# Patient Record
Sex: Male | Born: 1990 | Race: Black or African American | Hispanic: No | Marital: Single
Health system: Southern US, Community
[De-identification: ages and names within clinical notes are randomized; demographics above are authoritative.]

## PROBLEM LIST (undated history)

## (undated) DIAGNOSIS — L309 Dermatitis, unspecified: Secondary | ICD-10-CM

---

## 2010-12-05 ENCOUNTER — Encounter: Payer: Self-pay | Admitting: *Deleted

## 2010-12-05 ENCOUNTER — Emergency Department (HOSPITAL_COMMUNITY)
Admission: EM | Admit: 2010-12-05 | Discharge: 2010-12-05 | Disposition: A | Discharge status: Home / Self Care | Attending: Emergency Medicine | Admitting: Emergency Medicine

## 2010-12-05 DIAGNOSIS — H1030 Unspecified acute conjunctivitis, unspecified eye: Secondary | ICD-10-CM | POA: Insufficient documentation

## 2010-12-05 DIAGNOSIS — L309 Dermatitis, unspecified: Secondary | ICD-10-CM

## 2010-12-05 DIAGNOSIS — L259 Unspecified contact dermatitis, unspecified cause: Secondary | ICD-10-CM | POA: Insufficient documentation

## 2010-12-05 DIAGNOSIS — R21 Rash and other nonspecific skin eruption: Secondary | ICD-10-CM | POA: Insufficient documentation

## 2010-12-05 HISTORY — DX: Dermatitis, unspecified: L30.9

## 2010-12-05 MED ORDER — PREDNISONE 10 MG PO TABS: ORAL_TABLET | ORAL | Status: DC

## 2010-12-05 MED ORDER — TOBRAMYCIN 0.3 % OP SOLN
2.0000 [drp] | Freq: Once | OPHTHALMIC | Status: AC
  Administered 2010-12-05: 2 [drp] via OPHTHALMIC
  Filled 2010-12-05: qty 5

## 2010-12-05 MED ORDER — DEXAMETHASONE SODIUM PHOSPHATE 4 MG/ML IJ SOLN
10.0000 mg | Freq: Once | INTRAMUSCULAR | Status: AC
  Administered 2010-12-05: 10 mg via INTRAMUSCULAR
  Filled 2010-12-05: qty 3

## 2010-12-05 NOTE — ED Provider Notes (Signed)
History     CSN: [id number] Arrival date & time: 12/05/2010 10:39 AM  Chief Complaint  Patient presents with  . Rash    (Consider location/radiation/quality/duration/timing/severity/associated sxs/prior treatment) HPI Comments: Patient with hx of eczema c/o itching and "eczema flare up" for one week.  States that he has intermittent episodes of exacerbations of his eczema.  States he had similar episode 2-3 months ago and it improved after steroids.  He denies fever, pain, neck stiffness or headache.  He also c/o redness, drainage to the left eye for several days.  States that his left eye is "matted shut" in the mornings.    Patient is a 20 y.o. male presenting with rash. The history is provided by the patient.  Rash  This is a recurrent problem. The current episode started more than 2 days ago. The problem has not changed since onset.The problem is associated with nothing. There has been no fever. The rash is present on the neck, left arm, right arm, left lower leg, right lower leg and trunk. The patient is experiencing no pain. The pain has been constant since onset. Associated symptoms include itching. Pertinent negatives include no blisters, no pain and no weeping. He has tried nothing for the symptoms. The treatment provided no relief.    Past Medical History  Diagnosis Date  . Eczema     History reviewed. No pertinent past surgical history.  History reviewed. No pertinent family history.  History  Substance Use Topics  . Smoking status: Passive Smoker  . Smokeless tobacco: Not on file  . Alcohol Use: Yes     socially      Review of Systems  Constitutional: Negative for activity change and appetite change.  HENT: Negative for facial swelling, neck pain and neck stiffness.   Eyes: Positive for discharge, redness and itching. Negative for photophobia, pain and visual disturbance.  Respiratory: Negative for chest tightness.   Skin: Positive for itching and rash. Negative  for wound.  Neurological: Negative for dizziness, weakness, light-headedness and numbness.  Hematological: Negative for adenopathy. Does not bruise/bleed easily.  All other systems reviewed and are negative.    Allergies  Review of patient's allergies indicates no known allergies.  Home Medications  No current outpatient prescriptions on file.  BP 143/70  Pulse 94  Temp(Src) 98.8 F (37.1 C) (Oral)  Resp 14  Ht 5\' 10"  (1.778 m)  Wt 161 lb (73.029 kg)  BMI 23.10 kg/m2  SpO2 100%  Physical Exam  Constitutional: He is oriented to person, place, and time. He appears well-developed and well-nourished. No distress.  HENT:  Head: Normocephalic and atraumatic.  Mouth/Throat: Oropharynx is clear and moist.  Eyes: EOM are normal. Pupils are equal, round, and reactive to light. Left eye exhibits discharge and exudate. Left eye exhibits no chemosis and no hordeolum. No foreign body present in the left eye. Left conjunctiva is injected. No scleral icterus. Right pupil is round and reactive. Left pupil is round and reactive. Pupils are equal.  Musculoskeletal: He exhibits no edema and no tenderness.  Neurological: He is alert and oriented to person, place, and time. He has normal reflexes. No cranial nerve deficit. He exhibits normal muscle tone. Coordination normal.  Skin: Rash noted. No petechiae and no purpura noted. Rash is maculopapular. Rash is not nodular, not pustular, not vesicular and not urticarial.       Diffuse papular rash to various areas of the body including the left neck, bilateral UE's, left lower leg and chest.  No vesicles, erythema or drainage.  Mild scaling.      ED Course  Procedures (including critical care time)      MDM    Diffuse, papular rash to the left neck, left arm and bilateral LE's.  Also has purulent drainage in the left medial canthus of the eye with mild injection of the conjunctiva.  Patient is alert, vitals stable, non-toxic appearing.           Ora Bollig L. [place], [place] 12/06/10 [phone]

## 2010-12-05 NOTE — ED Notes (Signed)
Pt states has eczema has had all his life, has gotten worse past few days.  Pt has noted dry scaly patches on bilateral legs, rt wrist and at the shirt collar area. Also has drainage from rt eye, eye is reddned with scaly   Patch noted at eyebrow area. Pt states does itch and is uncomfortable.

## 2010-12-05 NOTE — ED Notes (Signed)
Pt c/o eczema flare up x 1 week; pt has rash all over body

## 2010-12-06 ENCOUNTER — Encounter (HOSPITAL_COMMUNITY): Payer: Self-pay | Admitting: Emergency Medicine

## 2010-12-13 NOTE — ED Provider Notes (Signed)
Medical screening examination/treatment/procedure(s) were performed by non-physician practitioner and as supervising physician I was immediately available for consultation/collaboration.   Forbes Cellar, MD 12/13/10 [phone]

## 2013-10-03 ENCOUNTER — Emergency Department (HOSPITAL_COMMUNITY)
Admission: EM | Admit: 2013-10-03 | Discharge: 2013-10-03 | Disposition: A | Discharge status: Home / Self Care | Attending: Emergency Medicine | Admitting: Emergency Medicine

## 2013-10-03 ENCOUNTER — Emergency Department (HOSPITAL_COMMUNITY)

## 2013-10-03 ENCOUNTER — Encounter (HOSPITAL_COMMUNITY): Payer: Self-pay | Admitting: Emergency Medicine

## 2013-10-03 DIAGNOSIS — IMO0002 Reserved for concepts with insufficient information to code with codable children: Secondary | ICD-10-CM | POA: Insufficient documentation

## 2013-10-03 DIAGNOSIS — S5011XA Contusion of right forearm, initial encounter: Secondary | ICD-10-CM

## 2013-10-03 DIAGNOSIS — S46909A Unspecified injury of unspecified muscle, fascia and tendon at shoulder and upper arm level, unspecified arm, initial encounter: Secondary | ICD-10-CM | POA: Insufficient documentation

## 2013-10-03 DIAGNOSIS — S5010XA Contusion of unspecified forearm, initial encounter: Secondary | ICD-10-CM | POA: Insufficient documentation

## 2013-10-03 DIAGNOSIS — W208XXA Other cause of strike by thrown, projected or falling object, initial encounter: Secondary | ICD-10-CM | POA: Insufficient documentation

## 2013-10-03 DIAGNOSIS — Y9389 Activity, other specified: Secondary | ICD-10-CM | POA: Insufficient documentation

## 2013-10-03 DIAGNOSIS — IMO0001 Reserved for inherently not codable concepts without codable children: Secondary | ICD-10-CM | POA: Insufficient documentation

## 2013-10-03 DIAGNOSIS — S20211A Contusion of right front wall of thorax, initial encounter: Secondary | ICD-10-CM

## 2013-10-03 DIAGNOSIS — Y9289 Other specified places as the place of occurrence of the external cause: Secondary | ICD-10-CM | POA: Insufficient documentation

## 2013-10-03 DIAGNOSIS — Z872 Personal history of diseases of the skin and subcutaneous tissue: Secondary | ICD-10-CM | POA: Insufficient documentation

## 2013-10-03 DIAGNOSIS — S20219A Contusion of unspecified front wall of thorax, initial encounter: Secondary | ICD-10-CM | POA: Insufficient documentation

## 2013-10-03 DIAGNOSIS — S4980XA Other specified injuries of shoulder and upper arm, unspecified arm, initial encounter: Secondary | ICD-10-CM | POA: Insufficient documentation

## 2013-10-03 MED ORDER — IBUPROFEN 600 MG PO TABS: 600.0000 mg | ORAL_TABLET | Freq: Four times a day (QID) | ORAL | Status: AC | PRN

## 2013-10-03 MED ORDER — IBUPROFEN 800 MG PO TABS
800.0000 mg | ORAL_TABLET | Freq: Once | ORAL | Status: AC
  Administered 2013-10-03: 800 mg via ORAL
  Filled 2013-10-03: qty 1

## 2013-10-03 NOTE — ED Provider Notes (Signed)
CSN: [id number]     Arrival date & time 10/03/13  1035 History   First MD Initiated Contact with Patient 10/03/13 1125   This chart was scribed for non-physician practitioner Burgess Amor, PA-C working with Benny Lennert, MD, by Andrew Au, ED Scribe. This patient was seen in room APFT20/APFT20 and the patient's care was started at 12:40 PM. Chief Complaint  Patient presents with  . Arm Pain    Patient is a 23 y.o. male presenting with arm pain. The history is provided by the patient. No language interpreter was used.  Arm Pain   Erik Olsen is a 23 y.o. male who presents to the Emergency Department complaining of right arm pain and right back pain. Pt reports he was opening a door when it fell off the hinges, landing on his right forearm and hitting his right back yesterday.  He reports pain is worse in back. He rates arm pain 4/10 and back pain 7/10. He reports discomfort with deep breathes. Pt denies taking pain medication.  Pt denies SOB. Pt is right hand dominate. 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     Past Medical History  Diagnosis Date  . Eczema    History reviewed. No pertinent past surgical history. No family history on file. History  Substance Use Topics  . Smoking status: Passive Smoke Exposure - Never Smoker  . Smokeless tobacco: Not on file  . Alcohol Use: Yes     Comment: socially    Review of Systems  Constitutional: Negative for fever and chills.  Musculoskeletal: Positive for back pain and myalgias.  Neurological: Negative for weakness and numbness.   Allergies  Review of patient's allergies indicates no known allergies.  Home Medications   Prior to Admission medications   Medication Sig Start Date End Date Taking? Authorizing  Provider  ibuprofen (ADVIL,MOTRIN) 600 MG tablet Take 1 tablet (600 mg total) by mouth every 6 (six) hours as needed. 10/03/13   Burgess Amor, PA-C   BP 117/73  Pulse 77  Temp(Src) 99 F (37.2 C) (Oral)  Resp 16  Ht 5\' 9"  (1.753 m)  Wt 160 lb (72.576 kg)  BMI 23.62 kg/m2  SpO2 99% Physical Exam  Nursing note and vitals reviewed. Constitutional: He is oriented to person, place, and time. He appears well-developed and well-nourished. No distress.  HENT:  Head: Normocephalic and atraumatic.  Eyes: Conjunctivae and EOM are normal.  Neck: Normal range of motion. Neck supple.  Cardiovascular: Normal rate and intact distal pulses.   Pedal pulses normal.  Pulmonary/Chest: Effort normal.  Abdominal: Soft. Bowel sounds are normal. He exhibits no distension and no mass.  Musculoskeletal: Normal range of motion. He exhibits no edema.       Thoracic back: He exhibits bony tenderness. He exhibits no edema, no deformity, no laceration and no spasm.       Lumbar back: He exhibits no tenderness, no swelling, no edema and no spasm.       Back:       Right forearm: He exhibits bony tenderness. He exhibits no swelling, no edema and no deformity.  Mild erythema, tender with linear abrasion right mid to upper back.  No crepitus, no deformity.  ttp mid right forearm with no edema or visible trauma.  Wrist, elbow FROM , can pronate and supinate with minimal discomfort.  Equal grip strength.  Neurological: He is alert and oriented to person, place, and time. He has normal strength. He displays no atrophy and no tremor. No sensory deficit. Gait normal.  Reflex Scores:      Patellar reflexes are 2+ on the right side and 2+ on the left side.      Achilles reflexes are 2+ on the right side and 2+ on the left side. No strength deficit noted in hip and knee flexor and extensor muscle groups.  Ankle flexion and extension intact.  Skin: Skin is warm and dry.  Psychiatric: He has a normal mood and affect. His  behavior is normal.   ED Course  Procedures (including critical care time) DIAGNOSTIC STUDIES: Oxygen Saturation is 99% on RA, normal by my interpretation.    COORDINATION OF CARE: 12:43 PM- Pt advised of plan for treatment and pt agrees. Pt has been advised to ice area of injury and take ibuprofen as needed. He has also been advised to minimize heavy lifting.  Labs Review Labs Reviewed - No data to display  Imaging Review No results found.   EKG Interpretation None      MDM   Final diagnoses:  Contusion of forearm, right, initial encounter  Contusion of ribs, right, initial encounter   Patients labs and/or radiological studies were viewed and considered during the medical decision making and disposition process. Rib detail right and right forearm films reviewed.  No acute fx or dislocation.  He was advised ice x 2 days,  Add heat on day three.  Ibuprofen prescribed.  Prn f/u with his provider if not improved over the next 7 days.  Work note given minimizing weight lifting. I personally performed the services described in this documentation, which was scribed in my presence. The recorded information has been reviewed and is accurate.      Burgess AmorJulie Mallori Araque, PA-C 10/05/13 1448

## 2013-10-03 NOTE — Discharge Instructions (Signed)
Contusion  A contusion is a deep bruise. Contusions are the result of an injury that caused bleeding under the skin. The contusion may turn blue, purple, or yellow. Minor injuries will give you a painless contusion, but more severe contusions may stay painful and swollen for a few weeks.   CAUSES   A contusion is usually caused by a blow, trauma, or direct force to an area of the body.  SYMPTOMS   · Swelling and redness of the injured area.  · Bruising of the injured area.  · Tenderness and soreness of the injured area.  · Pain.  DIAGNOSIS   The diagnosis can be made by taking a history and physical exam. An X-ray, CT scan, or MRI may be needed to determine if there were any associated injuries, such as fractures.  TREATMENT   Specific treatment will depend on what area of the body was injured. In general, the best treatment for a contusion is resting, icing, elevating, and applying cold compresses to the injured area. Over-the-counter medicines may also be recommended for pain control. Ask your caregiver what the best treatment is for your contusion.  HOME CARE INSTRUCTIONS   · Put ice on the injured area.  · Put ice in a plastic bag.  · Place a towel between your skin and the bag.  · Leave the ice on for 15-20 minutes, 3-4 times a day, or as directed by your health care provider.  · Only take over-the-counter or prescription medicines for pain, discomfort, or fever as directed by your caregiver. Your caregiver may recommend avoiding anti-inflammatory medicines (aspirin, ibuprofen, and naproxen) for 48 hours because these medicines may increase bruising.  · Rest the injured area.  · If possible, elevate the injured area to reduce swelling.  SEEK IMMEDIATE MEDICAL CARE IF:   · You have increased bruising or swelling.  · You have pain that is getting worse.  · Your swelling or pain is not relieved with medicines.  MAKE SURE YOU:   · Understand these instructions.  · Will watch your condition.  · Will get help right  away if you are not doing well or get worse.  Document Released: 12/04/2004 Document Revised: 03/01/2013 Document Reviewed: 12/30/2010  ExitCare® Patient Information ©2015 [employer]. This information is not intended to replace advice given to you by your health care provider. Make sure you discuss any questions you have with your health care provider.  Rib Contusion  A rib contusion (bruise) can occur by a blow to the chest or by a fall against a hard object. Usually these will be much better in a couple weeks. If X-rays were taken today and there are no broken bones (fractures), the diagnosis of bruising is made. However, broken ribs may not show up for several days, or may be discovered later on a routine X-ray when signs of healing show up. If this happens to you, it does not mean that something was missed on the X-ray, but simply that it did not show up on the first X-rays. Earlier diagnosis will not usually change the treatment.  HOME CARE INSTRUCTIONS   · Avoid strenuous activity. Be careful during activities and avoid bumping the injured ribs. Activities that pull on the injured ribs and cause pain should be avoided, if possible.  · For the first day or two, an ice pack used every 20 minutes while awake may be helpful. Put ice in a plastic bag and put a towel between the bag and   the skin.  · Eat a normal, well-balanced diet. Drink plenty of fluids to avoid constipation.  · Take deep breaths several times a day to keep lungs free of infection. Try to cough several times a day. Splint the injured area with a pillow while coughing to ease pain. Coughing can help prevent pneumonia.  · Wear a rib belt or binder only if told to do so by your caregiver. If you are wearing a rib belt or binder, you must do the breathing exercises as directed by your caregiver. If not used properly, rib belts or binders restrict breathing which can lead to pneumonia.  · Only take over-the-counter or prescription medicines for  pain, discomfort, or fever as directed by your caregiver.  SEEK MEDICAL CARE IF:   · You or your child has an oral temperature above 102° F (38.9° C).  · Your baby is older than 3 months with a rectal temperature of 100.5° F (38.1° C) or higher for more than 1 day.  · You develop a cough, with thick or bloody sputum.  SEEK IMMEDIATE MEDICAL CARE IF:   · You have difficulty breathing.  · You feel sick to your stomach (nausea), have vomiting or belly (abdominal) pain.  · You have worsening pain, not controlled with medications, or there is a change in the location of the pain.  · You develop sweating or radiation of the pain into the arms, jaw or shoulders, or become light headed or faint.  · You or your child has an oral temperature above 102° F (38.9° C), not controlled by medicine.  · Your or your baby is older than 3 months with a rectal temperature of 102° F (38.9° C) or higher.  · Your baby is 3 months old or younger with a rectal temperature of 100.4° F (38° C) or higher.  MAKE SURE YOU:   · Understand these instructions.  · Will watch your condition.  · Will get help right away if you are not doing well or get worse.  Document Released: 11/19/2000 Document Revised: 06/21/2012 Document Reviewed: 10/13/2007  ExitCare® Patient Information ©2015 [employer]. This information is not intended to replace advice given to you by your health care provider. Make sure you discuss any questions you have with your health care provider.

## 2013-10-03 NOTE — ED Notes (Signed)
Pt states a door slammed down on him with pain to his forearm, right forearm and right rib area. NAD.

## 2013-10-05 NOTE — ED Provider Notes (Signed)
Medical screening examination/treatment/procedure(s) were performed by non-physician practitioner and as supervising physician I was immediately available for consultation/collaboration.   EKG Interpretation None        Blaise Palladino L Cylan Borum, MD 10/05/13 1452 

## 2015-12-10 IMAGING — CR DG RIBS W/ CHEST 3+V*R*
5 series · 5 of 5 positions shown · non-contrast
Comparison: None.

CLINICAL DATA: Posterior right lower rib discomfort status post
trauma

EXAM:
RIGHT RIBS AND CHEST - 3+ VIEW

[view not recorded (1 of 5)]
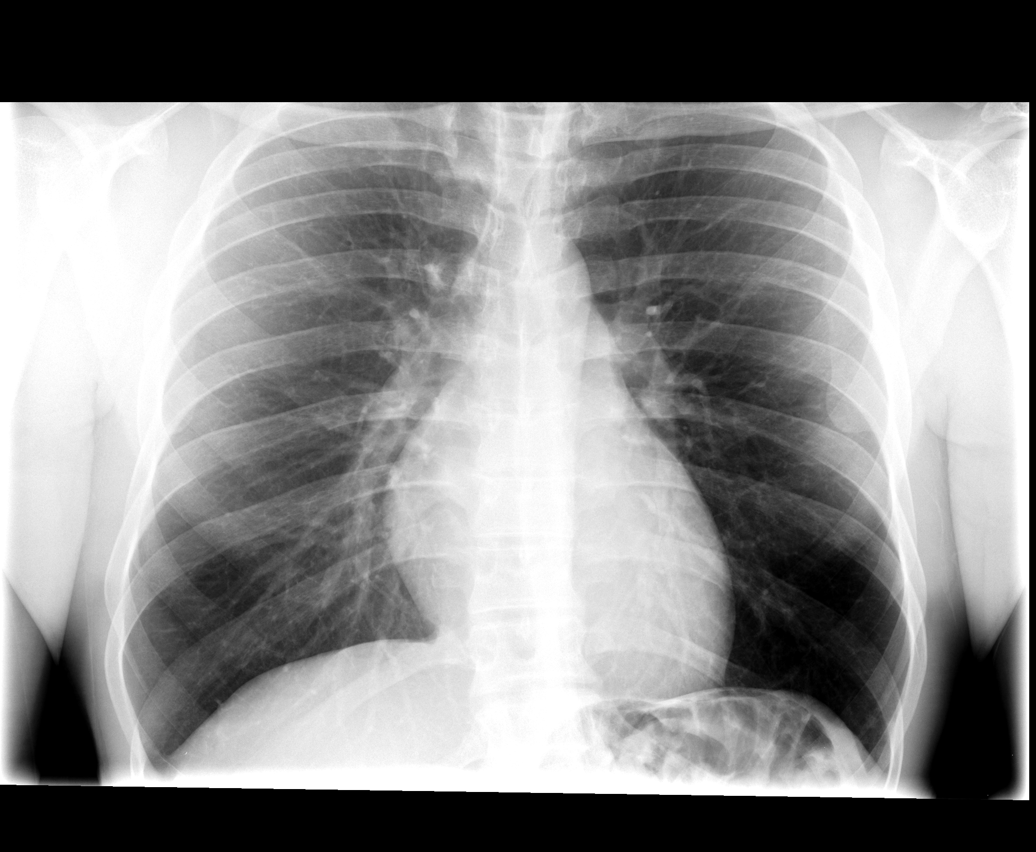

[view not recorded (2 of 5)]
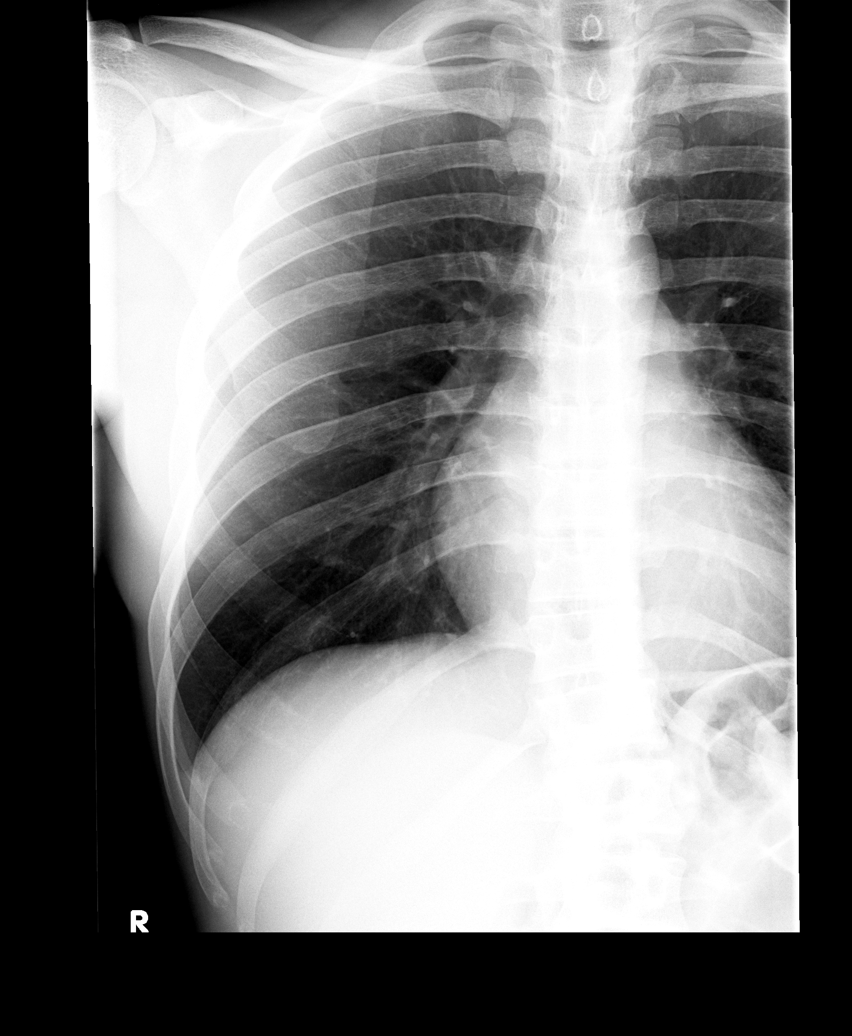

[view not recorded (3 of 5)]
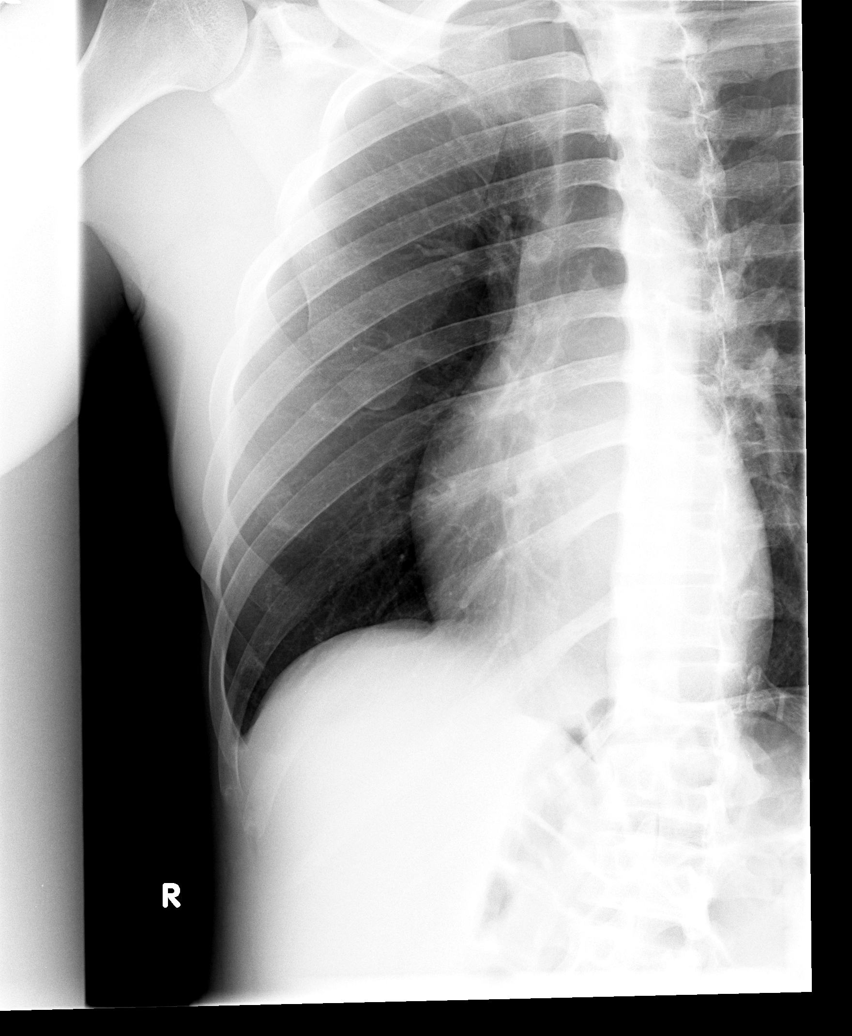

[view not recorded (4 of 5)]
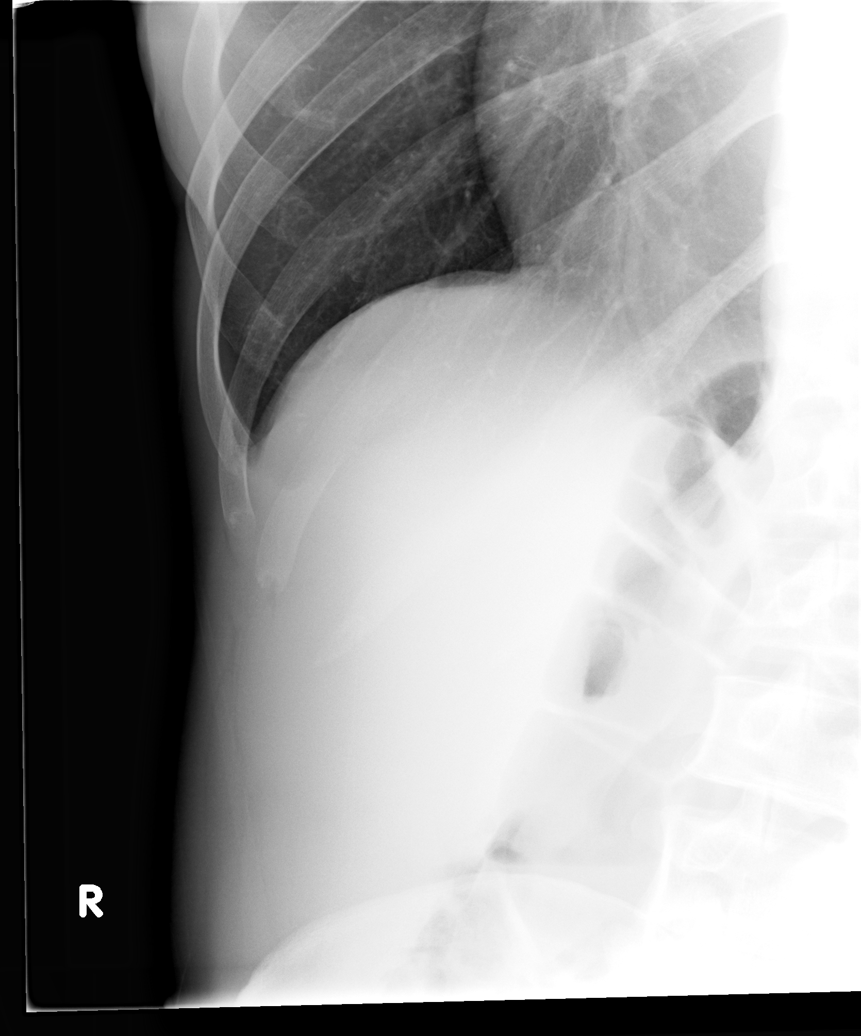

[view not recorded (5 of 5)]
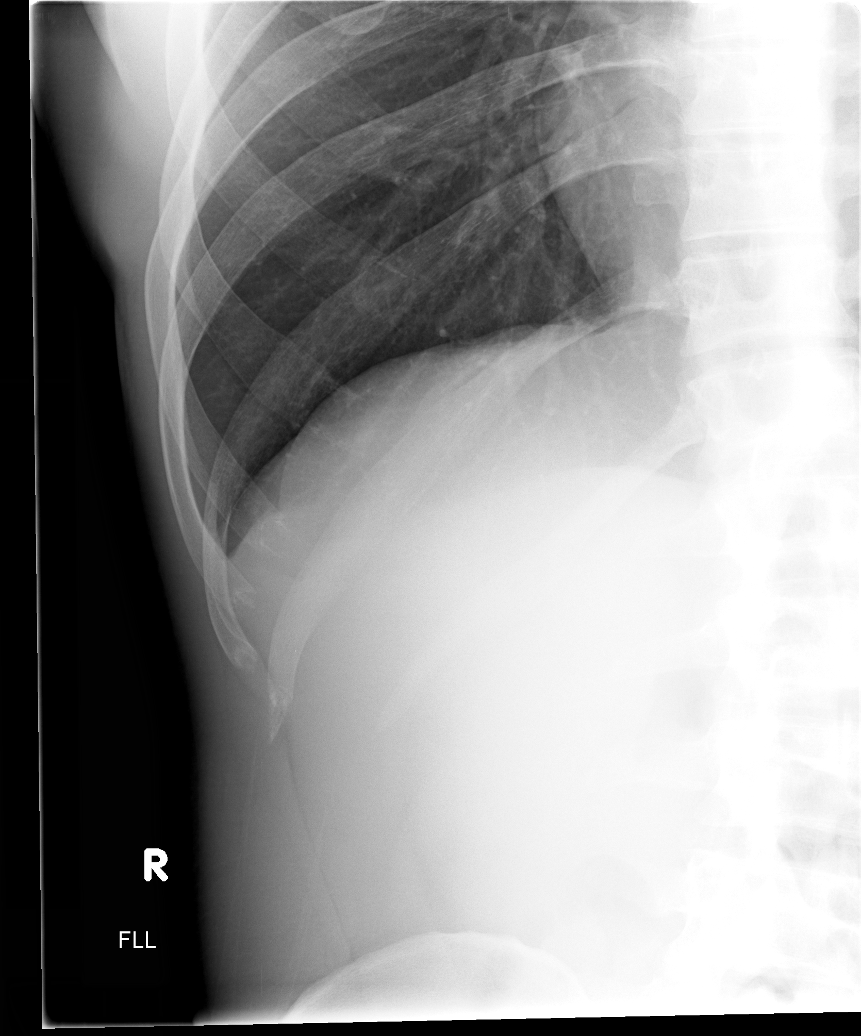

[5 of 5 positions shown; findings below may reference images not displayed]

FINDINGS: The lungs are well-expanded and clear. The heart and mediastinal
structures are normal. There is no pleural effusion or pneumothorax.

Right rib detail films exhibit no acute fracture nor dislocation.
IMPRESSION: There is no acute rib fracture nor other acute post traumatic injury
of the thorax.

## 2015-12-10 IMAGING — CR DG FOREARM 2V*R*
2 series · 2 of 2 positions shown · non-contrast
Comparison: None.

CLINICAL DATA: Status post trauma

EXAM:
RIGHT FOREARM - 2 VIEW

[view not recorded (1 of 2)]
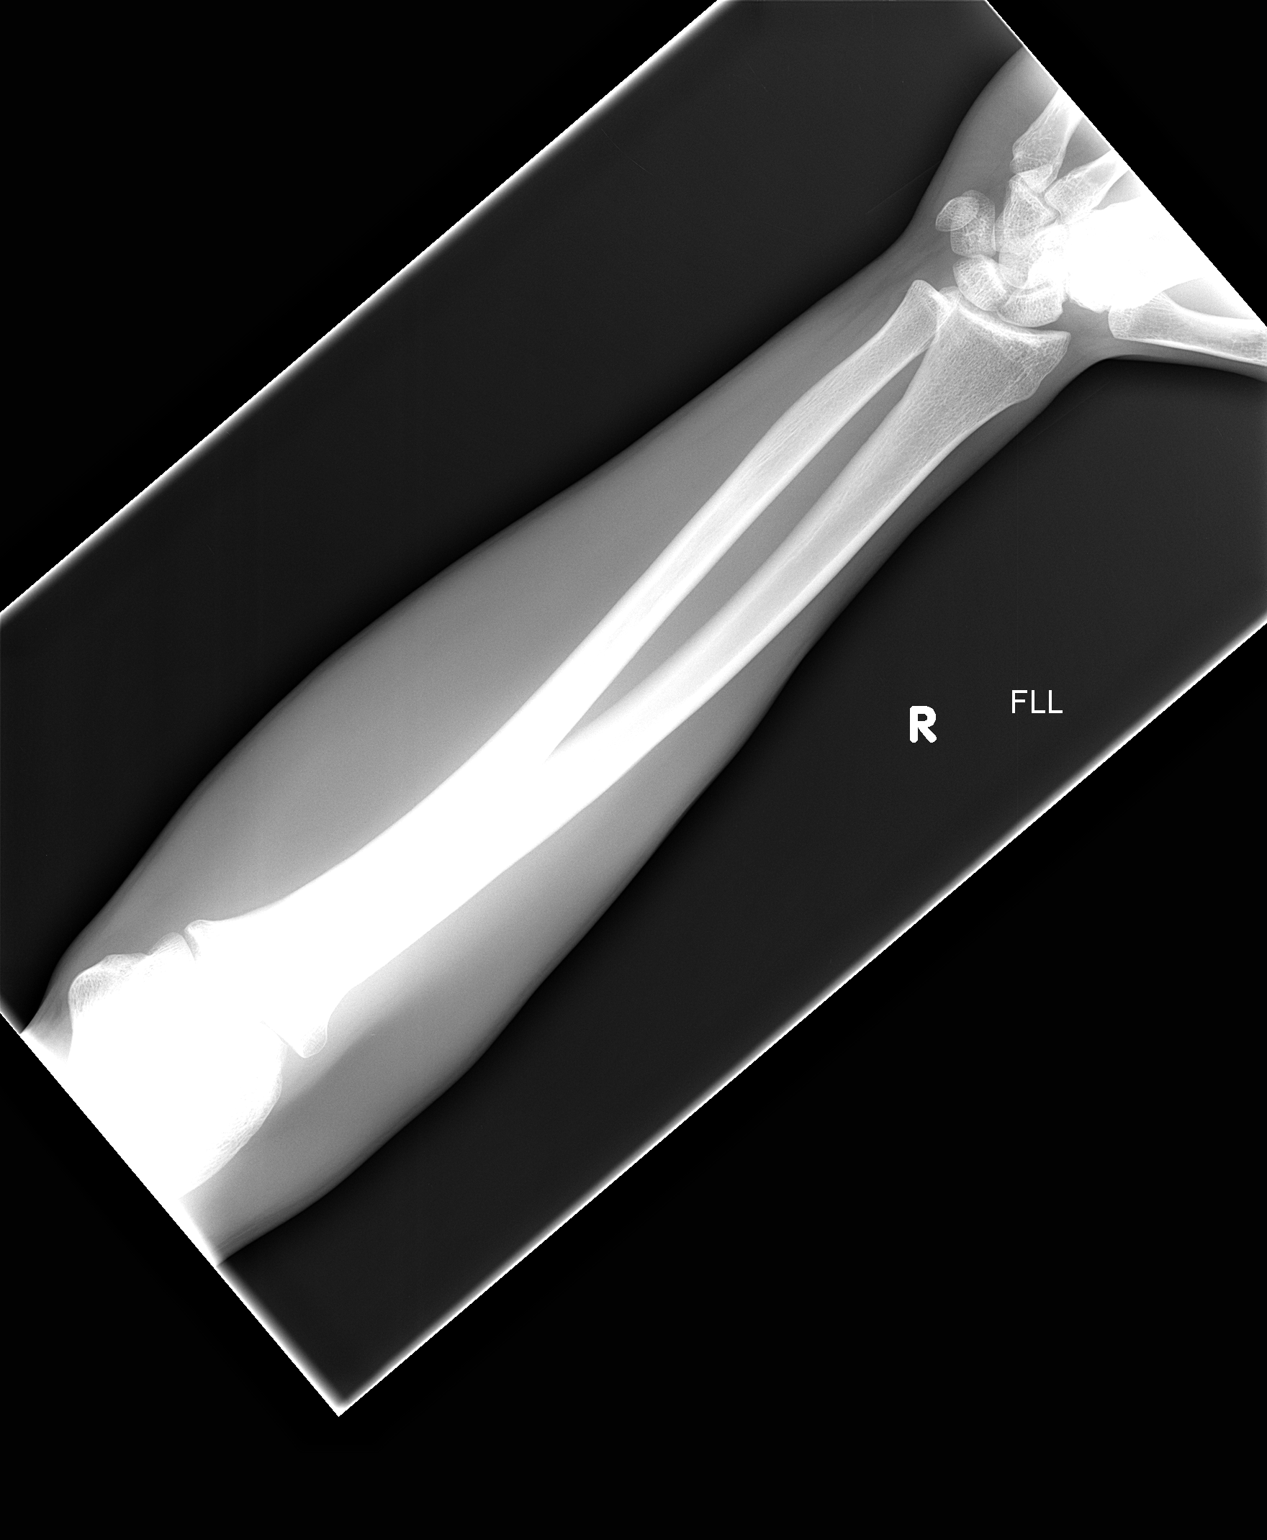

[view not recorded (2 of 2)]
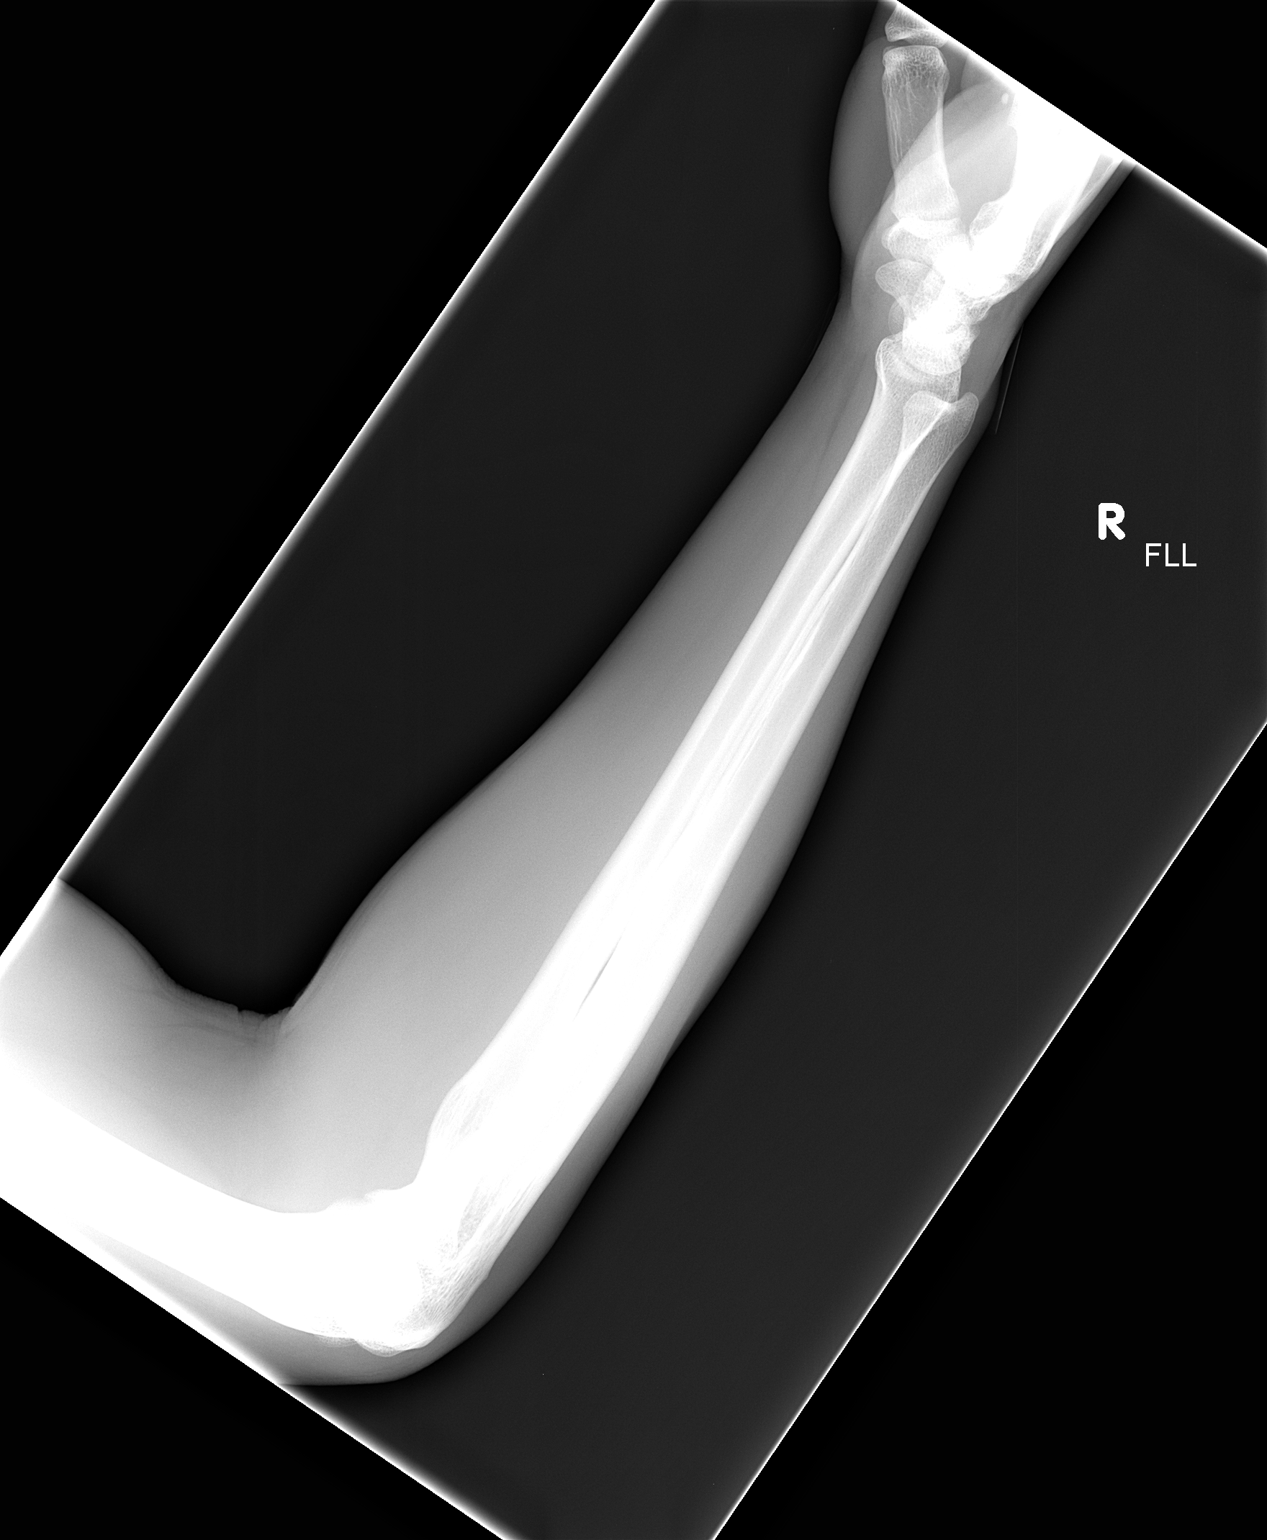

[2 of 2 positions shown; findings below may reference images not displayed]

FINDINGS: The rib right radius and ulna are adequately mineralized. There is
no acute fracture. The observed portions of the elbow and wrist are
normal. The soft tissues are unremarkable.
IMPRESSION: There is no acute bony abnormality of the right radius or ulna.

## 2019-03-16 ENCOUNTER — Other Ambulatory Visit: Payer: Self-pay | Admitting: Cardiology

## 2019-03-16 DIAGNOSIS — Z20822 Contact with and (suspected) exposure to covid-19: Secondary | ICD-10-CM

## 2019-03-17 LAB — NOVEL CORONAVIRUS, NAA: SARS-CoV-2, NAA: NOT DETECTED
